# Patient Record
Sex: Male | Born: 1970 | Race: Black or African American | Hispanic: No | Marital: Single | Smoking: Current every day smoker
Health system: Southern US, Community
[De-identification: ages and names within clinical notes are randomized; demographics above are authoritative.]

## PROBLEM LIST (undated history)

## (undated) DIAGNOSIS — I10 Essential (primary) hypertension: Secondary | ICD-10-CM

---

## 2000-03-26 ENCOUNTER — Emergency Department (HOSPITAL_COMMUNITY): Admission: EM | Admit: 2000-03-26 | Discharge: 2000-03-26 | Payer: Self-pay | Admitting: Emergency Medicine

## 2002-06-15 ENCOUNTER — Emergency Department (HOSPITAL_COMMUNITY): Admission: EM | Admit: 2002-06-15 | Discharge: 2002-06-15 | Payer: Self-pay | Admitting: Emergency Medicine

## 2002-06-15 ENCOUNTER — Encounter: Payer: Self-pay | Admitting: Emergency Medicine

## 2012-05-20 ENCOUNTER — Ambulatory Visit: Payer: Self-pay | Admitting: Family Medicine

## 2012-07-04 ENCOUNTER — Emergency Department (HOSPITAL_COMMUNITY): Payer: Worker's Compensation

## 2012-07-04 ENCOUNTER — Encounter (HOSPITAL_COMMUNITY): Payer: Self-pay | Admitting: Emergency Medicine

## 2012-07-04 ENCOUNTER — Emergency Department (HOSPITAL_COMMUNITY)
Admission: EM | Admit: 2012-07-04 | Discharge: 2012-07-04 | Disposition: A | Discharge status: Home / Self Care | Payer: Worker's Compensation | Attending: Emergency Medicine | Admitting: Emergency Medicine

## 2012-07-04 DIAGNOSIS — R5381 Other malaise: Secondary | ICD-10-CM | POA: Insufficient documentation

## 2012-07-04 DIAGNOSIS — Y9269 Other specified industrial and construction area as the place of occurrence of the external cause: Secondary | ICD-10-CM | POA: Insufficient documentation

## 2012-07-04 DIAGNOSIS — I10 Essential (primary) hypertension: Secondary | ICD-10-CM | POA: Insufficient documentation

## 2012-07-04 DIAGNOSIS — T588X1A Toxic effect of carbon monoxide from other source, accidental (unintentional), initial encounter: Secondary | ICD-10-CM | POA: Insufficient documentation

## 2012-07-04 DIAGNOSIS — T5894XA Toxic effect of carbon monoxide from unspecified source, undetermined, initial encounter: Secondary | ICD-10-CM | POA: Insufficient documentation

## 2012-07-04 DIAGNOSIS — R42 Dizziness and giddiness: Secondary | ICD-10-CM | POA: Insufficient documentation

## 2012-07-04 DIAGNOSIS — R05 Cough: Secondary | ICD-10-CM | POA: Insufficient documentation

## 2012-07-04 DIAGNOSIS — Y939 Activity, unspecified: Secondary | ICD-10-CM | POA: Insufficient documentation

## 2012-07-04 DIAGNOSIS — R51 Headache: Secondary | ICD-10-CM | POA: Insufficient documentation

## 2012-07-04 DIAGNOSIS — R630 Anorexia: Secondary | ICD-10-CM | POA: Insufficient documentation

## 2012-07-04 DIAGNOSIS — R059 Cough, unspecified: Secondary | ICD-10-CM | POA: Insufficient documentation

## 2012-07-04 DIAGNOSIS — F17200 Nicotine dependence, unspecified, uncomplicated: Secondary | ICD-10-CM | POA: Insufficient documentation

## 2012-07-04 DIAGNOSIS — T5891XA Toxic effect of carbon monoxide from unspecified source, accidental (unintentional), initial encounter: Secondary | ICD-10-CM

## 2012-07-04 DIAGNOSIS — R509 Fever, unspecified: Secondary | ICD-10-CM | POA: Insufficient documentation

## 2012-07-04 HISTORY — DX: Essential (primary) hypertension: I10

## 2012-07-04 LAB — CARBOXYHEMOGLOBIN
Carboxyhemoglobin: 2.8 % — ABNORMAL HIGH (ref 0.5–1.5)
Carboxyhemoglobin: 1.4 % (ref 0.5–1.5)
Methemoglobin: 1.1 % (ref 0.0–1.5)
Methemoglobin: 1 % (ref 0.0–1.5)
O2 Saturation: 69.1 %
O2 Saturation: 91.2 %
Total hemoglobin: 13.9 g/dL (ref 13.5–18.0)

## 2012-07-04 LAB — BASIC METABOLIC PANEL
BUN: 12 mg/dL (ref 6–23)
CO2: 23 mEq/L (ref 19–32)
Calcium: 9 mg/dL (ref 8.4–10.5)
Chloride: 107 mEq/L (ref 96–112)
Creatinine, Ser: 1.28 mg/dL (ref 0.50–1.35)
GFR calc Af Amer: 80 mL/min — ABNORMAL LOW (ref >90)
GFR calc non Af Amer: 69 mL/min — ABNORMAL LOW (ref >90)
Glucose, Bld: 110 mg/dL — ABNORMAL HIGH (ref 70–99)
Potassium: 4.5 mEq/L (ref 3.5–5.1)
Sodium: 142 mEq/L (ref 135–145)

## 2012-07-04 LAB — CBC
HCT: 39.6 % (ref 39.0–52.0)
Hemoglobin: 13.3 g/dL (ref 13.0–17.0)
MCH: 27.3 pg (ref 26.0–34.0)
MCHC: 33.6 g/dL (ref 30.0–36.0)
MCV: 81.1 fL (ref 78.0–100.0)
Platelets: 228 10*3/uL (ref 150–400)
RBC: 4.88 MIL/uL (ref 4.22–5.81)
RDW: 13.4 % (ref 11.5–15.5)
WBC: 6.6 10*3/uL (ref 4.0–10.5)

## 2012-07-04 NOTE — ED Provider Notes (Signed)
History  This chart was scribed for non-physician practitioner working with [name], MD by Ardeen Jourdain, ED Scribe. This patient was seen in room TR10C/TR10C and the patient's care was started at 1620.  CSN: [id number]  Arrival date & time 07/04/12  1600   First MD Initiated Contact with Patient 07/04/12 1620      Chief Complaint  Patient presents with  . Toxic Inhalation     The history is provided by the patient. No language interpreter was used.    Austin Pearson is a 42 y.o. male who presents to the Emergency Department complaining of possible toxic inhalation. He states he has been having SOB, lightheadedness, cough, fatigue, HA, short temper mood swings, fever and loss of appetite for the past 6 months. He describes his sputum as light brown in color. He denies any CP and chills as associated symptoms. He reports other people in his office have been having the same symptoms. He states the building was tested and there was a CO leak detected.   Past Medical History  Diagnosis Date  . Hypertension     History reviewed. No pertinent past surgical history.  History reviewed. No pertinent family history.  History  Substance Use Topics  . Smoking status: Current Every Day Smoker  . Smokeless tobacco: Not on file  . Alcohol Use: Yes     Comment: occ      Review of Systems  Constitutional: Positive for fever, activity change, appetite change and fatigue.  Respiratory: Positive for cough.   Neurological: Positive for dizziness and headaches.  All other systems reviewed and are negative.    Allergies  Review of patient's allergies indicates no known allergies.  Home Medications  No current outpatient prescriptions on file.  Triage Vitals: BP 150/80  Pulse 68  Temp 98.4 F (36.9 C) (Oral)  Resp 18  SpO2 97%  Physical Exam  Nursing note and vitals reviewed. Constitutional: He is oriented to person, place, and time. He appears well-developed and  well-nourished. No distress.  HENT:  Head: Normocephalic and atraumatic.       Pharynx mildly erythematous, no edema or exudate    Eyes: EOM are normal. Pupils are equal, round, and reactive to light. Right eye exhibits no discharge. Left eye exhibits no discharge.  Neck: Normal range of motion. Neck supple. No tracheal deviation present.  Cardiovascular: Normal rate, regular rhythm and normal heart sounds.  Exam reveals no gallop and no friction rub.   No murmur heard. Pulmonary/Chest: Effort normal and breath sounds normal. No respiratory distress. He has no wheezes. He has no rales.  Abdominal: Soft. Bowel sounds are normal. He exhibits no distension and no mass. There is no tenderness. There is no rebound and no guarding.  Musculoskeletal: Normal range of motion. He exhibits no edema.  Neurological: He is alert and oriented to person, place, and time. He has normal strength. No cranial nerve deficit or sensory deficit. He displays a negative Romberg sign. Coordination normal. GCS eye subscore is 4. GCS verbal subscore is 5. GCS motor subscore is 6.  Skin: Skin is warm and dry. No rash noted.  Psychiatric: He has a normal mood and affect. His behavior is normal.    ED Course  Procedures (including critical care time)  DIAGNOSTIC STUDIES: Oxygen Saturation is 97% on room air, normal by my interpretation.    COORDINATION OF CARE:   4:38 PM: Discussed treatment plan which includes CBC, BMP and carboxyhemoglobin with pt at bedside  and pt agreed to plan.    Labs Reviewed  BASIC METABOLIC PANEL - Abnormal; Notable for the following:    Glucose, Bld 110 (*)     GFR calc non Af Amer 69 (*)     GFR calc Af Amer 80 (*)     All other components within normal limits  CARBOXYHEMOGLOBIN - Abnormal; Notable for the following:    Carboxyhemoglobin 2.8 (*)     All other components within normal limits  CARBOXYHEMOGLOBIN - Abnormal; Notable for the following:    Total hemoglobin 13.2 (*)      All other components within normal limits  CBC   Dg Chest 2 View  07/04/2012  *RADIOLOGY REPORT*  Clinical Data: Possible carbon monoxide exposure.  CHEST - 2 VIEW  Comparison: None.  Findings:  The heart size and mediastinal contours are within normal limits.  Both lungs are clear.  The visualized skeletal structures are unremarkable.  IMPRESSION: No active cardiopulmonary disease.   Original Report Authenticated By: Davonna Belling, M.D.      1. Carbon monoxide poisoning       MDM  42 y/o male with CO exposure and elevated carboxyhemoglobin. He received oxygen for 4 hours and his level decreased to 1.4 from 2.8. I spoke with Angelique Blonder at poison control who confirmed this treatment. Neuro exam unremarkable. Patient stable for discharge. Return precautions discussed. Patient states understanding of plan and is agreeable.       I personally performed the services described in this documentation, which was scribed in my presence. The recorded information has been reviewed and is accurate.    Trevor Mace, PA-C 07/04/12 2246

## 2012-07-04 NOTE — ED Notes (Addendum)
I spoke to [employer] personnel from [facility] asked to be called back once the patient was discharged.  Ms. Arvilla Market wanted to ascertain if the patient was neurologically stable.  Patient was alert and oriented x4, had a steady gait and did not complain of pain or weakness.

## 2012-07-04 NOTE — ED Notes (Signed)
Patient is resting comfortably. 

## 2012-07-04 NOTE — ED Notes (Addendum)
Patient said he was a little lightheaded but attributed it to being in the bed all day.  Patient said he is ready to go home.  Patient's sister is taking the patient home.

## 2012-07-04 NOTE — ED Notes (Signed)
Pt here for possible carbon monoxide exposure at work; pt sts cough and feeling fatigue x 6 months and wants to be checked for possible exposure

## 2012-07-04 NOTE — ED Notes (Signed)
[name] with [facility] called to check on Mr. Austin Pearson's condition.  Ms. Arvilla Market suggested we assess his gait prior to being discharged.

## 2012-07-04 NOTE — ED Notes (Signed)
Alert and orientedx4.  Patient was explained discharge instructions and he understood them with no questions.

## 2012-07-05 NOTE — ED Provider Notes (Signed)
Medical screening examination/treatment/procedure(s) were performed by non-physician practitioner and as supervising physician I was immediately available for consultation/collaboration.   [name], MD 07/05/12 [phone]

## 2012-09-13 ENCOUNTER — Ambulatory Visit: Payer: Self-pay | Admitting: Family Medicine

## 2015-04-13 ENCOUNTER — Emergency Department (HOSPITAL_COMMUNITY)
Admission: EM | Admit: 2015-04-13 | Discharge: 2015-04-13 | Disposition: A | Discharge status: Home / Self Care | Payer: Self-pay | Attending: Emergency Medicine | Admitting: Emergency Medicine

## 2015-04-13 ENCOUNTER — Emergency Department (HOSPITAL_COMMUNITY): Payer: Self-pay

## 2015-04-13 ENCOUNTER — Encounter (HOSPITAL_COMMUNITY): Payer: Self-pay | Admitting: Emergency Medicine

## 2015-04-13 ENCOUNTER — Encounter (HOSPITAL_BASED_OUTPATIENT_CLINIC_OR_DEPARTMENT_OTHER): Payer: Self-pay | Admitting: Emergency Medicine

## 2015-04-13 DIAGNOSIS — S3991XA Unspecified injury of abdomen, initial encounter: Secondary | ICD-10-CM | POA: Insufficient documentation

## 2015-04-13 DIAGNOSIS — S4991XA Unspecified injury of right shoulder and upper arm, initial encounter: Secondary | ICD-10-CM | POA: Insufficient documentation

## 2015-04-13 DIAGNOSIS — T1490XA Injury, unspecified, initial encounter: Secondary | ICD-10-CM

## 2015-04-13 DIAGNOSIS — S4992XA Unspecified injury of left shoulder and upper arm, initial encounter: Secondary | ICD-10-CM | POA: Insufficient documentation

## 2015-04-13 DIAGNOSIS — Z720 Tobacco use: Secondary | ICD-10-CM | POA: Insufficient documentation

## 2015-04-13 DIAGNOSIS — S199XXA Unspecified injury of neck, initial encounter: Secondary | ICD-10-CM | POA: Insufficient documentation

## 2015-04-13 DIAGNOSIS — M25561 Pain in right knee: Secondary | ICD-10-CM

## 2015-04-13 DIAGNOSIS — Y9389 Activity, other specified: Secondary | ICD-10-CM | POA: Insufficient documentation

## 2015-04-13 DIAGNOSIS — I10 Essential (primary) hypertension: Secondary | ICD-10-CM | POA: Insufficient documentation

## 2015-04-13 DIAGNOSIS — M542 Cervicalgia: Secondary | ICD-10-CM

## 2015-04-13 DIAGNOSIS — S8991XA Unspecified injury of right lower leg, initial encounter: Secondary | ICD-10-CM | POA: Insufficient documentation

## 2015-04-13 DIAGNOSIS — Y999 Unspecified external cause status: Secondary | ICD-10-CM | POA: Insufficient documentation

## 2015-04-13 DIAGNOSIS — R1011 Right upper quadrant pain: Secondary | ICD-10-CM

## 2015-04-13 DIAGNOSIS — Y92410 Unspecified street and highway as the place of occurrence of the external cause: Secondary | ICD-10-CM | POA: Insufficient documentation

## 2015-04-13 DIAGNOSIS — S0990XA Unspecified injury of head, initial encounter: Secondary | ICD-10-CM | POA: Insufficient documentation

## 2015-04-13 LAB — CBC
HEMATOCRIT: 38.3 % — AB (ref 39.0–52.0)
HEMOGLOBIN: 12.8 g/dL — AB (ref 13.0–17.0)
MCH: 27.8 pg (ref 26.0–34.0)
MCHC: 33.4 g/dL (ref 30.0–36.0)
MCV: 83.3 fL (ref 78.0–100.0)
Platelets: 180 10*3/uL (ref 150–400)
RBC: 4.6 MIL/uL (ref 4.22–5.81)
RDW: 14.2 % (ref 11.5–15.5)
WBC: 4.9 10*3/uL (ref 4.0–10.5)

## 2015-04-13 LAB — BASIC METABOLIC PANEL
ANION GAP: 9 (ref 5–15)
BUN: 16 mg/dL (ref 6–20)
CHLORIDE: 110 mmol/L (ref 101–111)
CO2: 22 mmol/L (ref 22–32)
Calcium: 8.9 mg/dL (ref 8.9–10.3)
Creatinine, Ser: 1.08 mg/dL (ref 0.61–1.24)
GFR calc Af Amer: >60 mL/min (ref >60)
GFR calc non Af Amer: >60 mL/min (ref >60)
GLUCOSE: 80 mg/dL (ref 65–99)
POTASSIUM: 4 mmol/L (ref 3.5–5.1)
Sodium: 141 mmol/L (ref 135–145)

## 2015-04-13 MED ORDER — IOHEXOL 300 MG/ML  SOLN
100.0000 mL | Freq: Once | INTRAMUSCULAR | Status: AC | PRN
  Administered 2015-04-13: 100 mL via INTRAVENOUS

## 2015-04-13 MED ORDER — OXYCODONE-ACETAMINOPHEN 5-325 MG PO TABS
1.0000 | ORAL_TABLET | Freq: Once | ORAL | Status: AC
  Administered 2015-04-13: 1 via ORAL
  Filled 2015-04-13: qty 1

## 2015-04-13 MED ORDER — METHOCARBAMOL 500 MG PO TABS: 500.0000 mg | ORAL_TABLET | Freq: Two times a day (BID) | ORAL | Status: AC

## 2015-04-13 MED ORDER — HYDROCODONE-ACETAMINOPHEN 5-325 MG PO TABS
1.0000 | ORAL_TABLET | ORAL | Status: AC | PRN
Start: 2015-04-13 — End: ?

## 2015-04-13 MED ORDER — ONDANSETRON HCL 4 MG PO TABS: 4.0000 mg | ORAL_TABLET | Freq: Three times a day (TID) | ORAL | Status: AC | PRN

## 2015-04-13 MED ORDER — METHOCARBAMOL 500 MG PO TABS
1000.0000 mg | ORAL_TABLET | Freq: Once | ORAL | Status: AC
  Administered 2015-04-13: 1000 mg via ORAL
  Filled 2015-04-13: qty 2

## 2015-04-13 MED ORDER — ONDANSETRON 4 MG PO TBDP
4.0000 mg | ORAL_TABLET | Freq: Once | ORAL | Status: AC
  Administered 2015-04-13: 4 mg via ORAL
  Filled 2015-04-13: qty 1

## 2015-04-13 MED ORDER — IBUPROFEN 800 MG PO TABS
800.0000 mg | ORAL_TABLET | Freq: Once | ORAL | Status: AC
  Administered 2015-04-13: 800 mg via ORAL
  Filled 2015-04-13: qty 1
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
